# Patient Record
Sex: Female | Born: 1935 | Race: White | Hispanic: No | Smoking: Never smoker
Health system: Southern US, Community
[De-identification: ages and names within clinical notes are randomized; demographics above are authoritative.]

## PROBLEM LIST (undated history)

## (undated) DIAGNOSIS — J45909 Unspecified asthma, uncomplicated: Secondary | ICD-10-CM

## (undated) DIAGNOSIS — C801 Malignant (primary) neoplasm, unspecified: Secondary | ICD-10-CM

## (undated) DIAGNOSIS — C50919 Malignant neoplasm of unspecified site of unspecified female breast: Secondary | ICD-10-CM

## (undated) DIAGNOSIS — Z923 Personal history of irradiation: Secondary | ICD-10-CM

## (undated) HISTORY — PX: ANKLE SURGERY: SHX546

## (undated) HISTORY — PX: KNEE SURGERY: SHX244

## (undated) HISTORY — PX: ABDOMINAL HYSTERECTOMY: SHX81

---

## 1968-02-23 DIAGNOSIS — C801 Malignant (primary) neoplasm, unspecified: Secondary | ICD-10-CM

## 1968-02-23 HISTORY — DX: Malignant (primary) neoplasm, unspecified: C80.1

## 1995-02-23 DIAGNOSIS — C50919 Malignant neoplasm of unspecified site of unspecified female breast: Secondary | ICD-10-CM

## 1995-02-23 DIAGNOSIS — Z923 Personal history of irradiation: Secondary | ICD-10-CM

## 1995-02-23 HISTORY — DX: Malignant neoplasm of unspecified site of unspecified female breast: C50.919

## 1995-02-23 HISTORY — PX: BREAST BIOPSY: SHX20

## 1995-02-23 HISTORY — DX: Personal history of irradiation: Z92.3

## 1995-02-23 HISTORY — PX: BREAST LUMPECTOMY: SHX2

## 2004-03-24 ENCOUNTER — Ambulatory Visit: Payer: Self-pay | Admitting: Unknown Physician Specialty

## 2005-02-19 ENCOUNTER — Ambulatory Visit: Payer: Self-pay | Admitting: Gastroenterology

## 2005-05-20 ENCOUNTER — Ambulatory Visit: Payer: Self-pay | Admitting: Unknown Physician Specialty

## 2006-05-25 ENCOUNTER — Ambulatory Visit: Payer: Self-pay | Admitting: Unknown Physician Specialty

## 2007-06-05 ENCOUNTER — Ambulatory Visit: Payer: Self-pay | Admitting: Unknown Physician Specialty

## 2008-06-10 ENCOUNTER — Ambulatory Visit: Payer: Self-pay | Admitting: Unknown Physician Specialty

## 2008-06-14 ENCOUNTER — Ambulatory Visit: Payer: Self-pay | Admitting: Unknown Physician Specialty

## 2008-11-25 ENCOUNTER — Ambulatory Visit: Payer: Self-pay | Admitting: Surgery

## 2009-05-16 ENCOUNTER — Ambulatory Visit: Payer: Self-pay | Admitting: Internal Medicine

## 2009-05-28 ENCOUNTER — Ambulatory Visit: Payer: Self-pay | Admitting: Orthopedic Surgery

## 2009-06-03 ENCOUNTER — Ambulatory Visit: Payer: Self-pay | Admitting: Orthopedic Surgery

## 2009-09-22 ENCOUNTER — Ambulatory Visit: Payer: Self-pay | Admitting: Orthopedic Surgery

## 2009-09-25 ENCOUNTER — Inpatient Hospital Stay: Payer: Self-pay | Admitting: Orthopedic Surgery

## 2009-11-10 ENCOUNTER — Ambulatory Visit: Payer: Self-pay | Admitting: Unknown Physician Specialty

## 2010-03-18 ENCOUNTER — Ambulatory Visit: Payer: Self-pay | Admitting: Gastroenterology

## 2010-11-19 ENCOUNTER — Ambulatory Visit: Payer: Self-pay | Admitting: Unknown Physician Specialty

## 2010-11-30 ENCOUNTER — Ambulatory Visit: Payer: Self-pay | Admitting: Unknown Physician Specialty

## 2011-12-01 ENCOUNTER — Ambulatory Visit: Payer: Self-pay | Admitting: Internal Medicine

## 2012-12-04 ENCOUNTER — Ambulatory Visit: Payer: Self-pay | Admitting: Internal Medicine

## 2013-01-04 ENCOUNTER — Ambulatory Visit: Payer: Self-pay | Admitting: Internal Medicine

## 2013-12-06 ENCOUNTER — Ambulatory Visit: Payer: Self-pay | Admitting: Internal Medicine

## 2015-05-07 ENCOUNTER — Other Ambulatory Visit: Payer: Self-pay | Admitting: Internal Medicine

## 2015-05-07 DIAGNOSIS — Z1231 Encounter for screening mammogram for malignant neoplasm of breast: Secondary | ICD-10-CM

## 2015-05-22 ENCOUNTER — Ambulatory Visit
Admission: RE | Admit: 2015-05-22 | Discharge: 2015-05-22 | Disposition: A | Discharge status: Home / Self Care | Payer: Medicare Other | Source: Ambulatory Visit | Attending: Internal Medicine | Admitting: Internal Medicine

## 2015-05-22 DIAGNOSIS — Z1231 Encounter for screening mammogram for malignant neoplasm of breast: Secondary | ICD-10-CM | POA: Insufficient documentation

## 2015-05-22 HISTORY — DX: Malignant neoplasm of unspecified site of unspecified female breast: C50.919

## 2015-05-22 HISTORY — DX: Malignant (primary) neoplasm, unspecified: C80.1

## 2016-01-14 ENCOUNTER — Emergency Department: Payer: Medicare Other

## 2016-01-14 ENCOUNTER — Encounter: Payer: Self-pay | Admitting: Emergency Medicine

## 2016-01-14 ENCOUNTER — Emergency Department
Admission: EM | Admit: 2016-01-14 | Discharge: 2016-01-14 | Disposition: A | Discharge status: Home / Self Care | Payer: Medicare Other | Attending: Emergency Medicine | Admitting: Emergency Medicine

## 2016-01-14 DIAGNOSIS — Z853 Personal history of malignant neoplasm of breast: Secondary | ICD-10-CM | POA: Diagnosis not present

## 2016-01-14 DIAGNOSIS — Z79899 Other long term (current) drug therapy: Secondary | ICD-10-CM | POA: Diagnosis not present

## 2016-01-14 DIAGNOSIS — J45901 Unspecified asthma with (acute) exacerbation: Secondary | ICD-10-CM | POA: Diagnosis not present

## 2016-01-14 DIAGNOSIS — R0602 Shortness of breath: Secondary | ICD-10-CM | POA: Diagnosis present

## 2016-01-14 HISTORY — DX: Unspecified asthma, uncomplicated: J45.909

## 2016-01-14 LAB — COMPREHENSIVE METABOLIC PANEL
ALBUMIN: 4.2 g/dL (ref 3.5–5.0)
ALK PHOS: 66 U/L (ref 38–126)
ALT: 20 U/L (ref 14–54)
AST: 28 U/L (ref 15–41)
Anion gap: 9 (ref 5–15)
BILIRUBIN TOTAL: 0.6 mg/dL (ref 0.3–1.2)
BUN: 14 mg/dL (ref 6–20)
CALCIUM: 8.8 mg/dL — AB (ref 8.9–10.3)
CO2: 28 mmol/L (ref 22–32)
CREATININE: 0.7 mg/dL (ref 0.44–1.00)
Chloride: 95 mmol/L — ABNORMAL LOW (ref 101–111)
GFR calc Af Amer: >60 mL/min (ref >60)
GFR calc non Af Amer: >60 mL/min (ref >60)
GLUCOSE: 127 mg/dL — AB (ref 65–99)
Potassium: 3.8 mmol/L (ref 3.5–5.1)
Sodium: 132 mmol/L — ABNORMAL LOW (ref 135–145)
TOTAL PROTEIN: 7.4 g/dL (ref 6.5–8.1)

## 2016-01-14 LAB — CBC WITH DIFFERENTIAL/PLATELET
BASOS ABS: 0.1 10*3/uL (ref 0–0.1)
BASOS PCT: 1 %
EOS ABS: 0.5 10*3/uL (ref 0–0.7)
Eosinophils Relative: 5 %
HCT: 36.2 % (ref 35.0–47.0)
Hemoglobin: 12.7 g/dL (ref 12.0–16.0)
Lymphocytes Relative: 5 %
Lymphs Abs: 0.4 10*3/uL — ABNORMAL LOW (ref 1.0–3.6)
MCH: 32.5 pg (ref 26.0–34.0)
MCHC: 35 g/dL (ref 32.0–36.0)
MCV: 92.9 fL (ref 80.0–100.0)
MONO ABS: 0.5 10*3/uL (ref 0.2–0.9)
Monocytes Relative: 6 %
Neutro Abs: 7.3 10*3/uL — ABNORMAL HIGH (ref 1.4–6.5)
Neutrophils Relative %: 83 %
PLATELETS: 173 10*3/uL (ref 150–440)
RBC: 3.9 MIL/uL (ref 3.80–5.20)
RDW: 13.4 % (ref 11.5–14.5)
WBC: 8.7 10*3/uL (ref 3.6–11.0)

## 2016-01-14 LAB — TROPONIN I: Troponin I: <0.03 ng/mL (ref <0.03)

## 2016-01-14 LAB — BRAIN NATRIURETIC PEPTIDE: B Natriuretic Peptide: 202 pg/mL — ABNORMAL HIGH (ref 0.0–100.0)

## 2016-01-14 MED ORDER — METHYLPREDNISOLONE SODIUM SUCC 125 MG IJ SOLR
125.0000 mg | Freq: Once | INTRAMUSCULAR | Status: AC
  Administered 2016-01-14: 125 mg via INTRAVENOUS
  Filled 2016-01-14: qty 2

## 2016-01-14 MED ORDER — IPRATROPIUM-ALBUTEROL 0.5-2.5 (3) MG/3ML IN SOLN
3.0000 mL | Freq: Once | RESPIRATORY_TRACT | Status: AC
  Administered 2016-01-14: 3 mL via RESPIRATORY_TRACT
  Filled 2016-01-14: qty 3

## 2016-01-14 MED ORDER — PREDNISONE 20 MG PO TABS: 40.0000 mg | ORAL_TABLET | Freq: Every day | ORAL | 0 refills | Status: AC

## 2016-01-14 NOTE — Discharge Instructions (Signed)
Please return immediately if condition worsens. Please contact her primary physician or the physician you were given for referral. If you have any specialist physicians involved in her treatment and plan please also contact them. Thank you for using [facility]. ° °

## 2016-01-14 NOTE — ED Triage Notes (Signed)
Patient to ER via [facility] from home for c/o shortness of breath. Reports chest pain for few moments last night, none currently or today. Patient has h/o asthma, no COPD or CHF reported. Patient states she is more short of breath on exertion. Patient's BP for EMS was 208/108 and 200/100. Was given 324mg  ASA via EMS. +Swelling to BLE. Patient able to speak in complete sentences without difficulty upon arrival. No cardiac history, changes seen en route on EKG for EMS.

## 2016-01-14 NOTE — ED Provider Notes (Signed)
Time Seen: Approximately 0928  I have reviewed the triage notes  Chief Complaint: Shortness of Breath   History of Present Illness: Lynn Griffith is a 82 y.o. female who has a history of asthma and reactive airway disease. Patient notified EMS for some shortness of breath that started last night without any pain to this historian. She did receive aspirin by EMS. She has some chronic bilateral leg swelling but denies any cardiovascular history such as pulmonary edema etc. History of breast cancer but did not develop any pulmonary emboli etc. The patient denies any nausea, vomiting, fever. She has had a dry nonproductive cough and per EMS was 91% on room air. Here she is 99 % on room air.   Past Medical History:  Diagnosis Date  . Asthma   . Breast cancer ([place])    Uterine  . Cancer ([place]) 1997   Breast- radiation- Rt    There are no active problems to display for this patient.   Past Surgical History:  Procedure Laterality Date  . ABDOMINAL HYSTERECTOMY    . ANKLE SURGERY Right   . BREAST BIOPSY Right 1997   +  . KNEE SURGERY Right     Past Surgical History:  Procedure Laterality Date  . ABDOMINAL HYSTERECTOMY    . ANKLE SURGERY Right   . BREAST BIOPSY Right 1997   +  . KNEE SURGERY Right     Current Outpatient Rx  . Order #: [id number] Class: Historical Med  . Order #: [id number] Class: Historical Med  . Order #: [id number] Class: Historical Med  . Order #: [id number] Class: Historical Med  . Order #: [id number] Class: Historical Med  . Order #: [id number] Class: Historical Med  . Order #: [id number] Class: Historical Med  . Order #: [id number] Class: Historical Med  . Order #: [id number] Class: Historical Med    Allergies:  Biaxin [clarithromycin]; Fosamax [alendronate sodium]; Hctz [hydrochlorothiazide]; Mobic [meloxicam]; Norvasc [amlodipine besylate]; and Sulfa antibiotics  Family History: Family History  Problem Relation Age of Onset  . Breast cancer Neg Hx      Social History: Social History  Substance Use Topics  . Smoking status: Never Smoker  . Smokeless tobacco: Never Used  . Alcohol use No     Review of Systems:   10 point review of systems was performed and was otherwise negative:  Constitutional: No fever Eyes: No visual disturbances ENT: No sore throat, ear pain Cardiac: No chest pain Respiratory: Shortness of breath with a dry nonproductive cough Abdomen: No abdominal pain, no vomiting, No diarrhea Endocrine: No weight loss, No night sweats Extremities: Unchanged peripheral edema Skin: No rashes, easy bruising Neurologic: No focal weakness, trouble with speech or swollowing Urologic: No dysuria, Hematuria, or urinary frequency   Physical Exam:  ED Triage Vitals  Enc Vitals Group     BP 01/14/16 0903 (!) 169/94     Pulse Rate 01/14/16 0903 97     Resp 01/14/16 0903 (!) 22     Temp 01/14/16 0903 98.1 F (36.7 C)     Temp Source 01/14/16 0903 Oral     SpO2 01/14/16 0856 91 %     Weight 01/14/16 0904 160 lb (72.6 kg)     Height 01/14/16 0904 4\' 10"  (1.473 m)     Head Circumference --      Peak Flow --      Pain Score --      Pain Loc --      Pain Edu? --      Excl.  in [place]? --     General: Awake , Alert , and Oriented times 3; GCS 15 Patient speaks in interrupted sentences but does not appear to be in any respiratory distress at this time. Wet sounding cough at the bedside Head: Normal cephalic , atraumatic Eyes: Pupils equal , round, reactive to light Nose/Throat: No nasal drainage, patent upper airway without erythema or exudate.  Neck: Supple, Full range of motion, No anterior adenopathy or palpable thyroid masses Lungs: Bilateral wheezing and rhonchi heard symmetrically from the base to the apices  Heart: Regular rate, regular rhythm without murmurs , gallops , or rubs Abdomen: Soft, non tender without rebound, guarding , or rigidity; bowel sounds positive and symmetric in all 4 quadrants. No organomegaly  .        Extremities: 2 plus symmetric pulses. Mild bilateral circumferential edema, no clubbing or cyanosis Neurologic: normal ambulation, Motor symmetric without deficits, sensory intact Skin: warm, dry, no rashes   Labs:   All laboratory work was reviewed including any pertinent negatives or positives listed below:  Labs Reviewed  COMPREHENSIVE METABOLIC PANEL - Abnormal; Notable for the following:       Result Value   Sodium 132 (*)    Chloride 95 (*)    Glucose, Bld 127 (*)    Calcium 8.8 (*)    All other components within normal limits  CBC WITH DIFFERENTIAL/PLATELET - Abnormal; Notable for the following:    Neutro Abs 7.3 (*)    Lymphs Abs 0.4 (*)    All other components within normal limits  BRAIN NATRIURETIC PEPTIDE - Abnormal; Notable for the following:    B Natriuretic Peptide 202.0 (*)    All other components within normal limits  TROPONIN I  Laboratory work was reviewed and showed no clinically significant abnormalities.   EKG: * ED ECG REPORT I, Daymon Larsen, the attending physician, personally viewed and interpreted this ECG.  Date: 01/14/2016 EKG [id number] Rate: 96 Rhythm: normal sinus rhythm QRS Axis: normal Intervals: normal ST/T Wave abnormalities: Nonspecific ST-T wave abnormalities Conduction Disturbances: none Narrative Interpretation: unremarkable No acute ischemic changes   Radiology: "Dg Chest Port 1 View  Result Date: 01/14/2016 CLINICAL DATA:  Shortness of breath, some chest pain, history of asthma EXAM: PORTABLE CHEST 1 VIEW COMPARISON:  Chest x-ray of 09/26/2009 FINDINGS: Minimal linear atelectasis is noted of both lung bases. No pneumonia or effusion is seen. Mediastinal and hilar contours are unremarkable. The heart is within normal limits in size. The bones appear somewhat osteopenic. IMPRESSION: Mild bibasilar linear atelectasis. No definite pneumonia or effusion. Electronically Signed   By: Ivar Drape M.D.   On: 01/14/2016 09:19   " I personally reviewed the radiologic studies    ED Course:  Patient's differential includes pulmonary edema, acute coronary syndrome, pulmonary embolism, acute bronchitis with reactive airway disease, etc. I felt given her current clinical presentation and objective findings this most likely was bronchitis with bronchospasm.  ----------------------------------------- 10:08 AM on 01/14/2016 -----------------------------------------  Patient was reexamined and still has some audible wheezing heard bilaterally in all lung fields and symmetrically duo neb has been ordered  ----------------------------------------- 11:21 AM on 01/14/2016 -----------------------------------------  Second breathing treatment seemed to cause improvement at this time. The patient was advised continue with her current medications at home. Her blood pressure elevations likely transient due to her shortness of breath, etc. I did not see a reason to increase her blood pressure medication at this time. Her second nebulizer and reexamination shows decreased wheezing  and rhonchi bilaterally. She's been up and ambulatory maintaining normal saturations etc. Clinical Course      Assessment:  Acute bronchitis with bronchospasm     Plan:  Outpatient " New Prescriptions   PREDNISONE (DELTASONE) 20 MG TABLET    Take 2 tablets (40 mg total) by mouth daily.  " Patient was advised to return immediately if condition worsens. Patient was advised to follow up with their primary care physician or other specialized physicians involved in their outpatient care. The patient and/or family member/power of attorney had laboratory results reviewed at the bedside. All questions and concerns were addressed and appropriate discharge instructions were distributed by the nursing staff.             Daymon Larsen, MD 01/14/16 [phone]

## 2016-02-05 ENCOUNTER — Other Ambulatory Visit: Payer: Self-pay | Admitting: Internal Medicine

## 2016-02-05 DIAGNOSIS — Z1231 Encounter for screening mammogram for malignant neoplasm of breast: Secondary | ICD-10-CM

## 2016-05-24 ENCOUNTER — Ambulatory Visit: Payer: Medicare Other | Attending: Internal Medicine

## 2016-06-22 ENCOUNTER — Ambulatory Visit
Admission: RE | Admit: 2016-06-22 | Discharge: 2016-06-22 | Disposition: A | Discharge status: Home / Self Care | Payer: Medicare Other | Source: Ambulatory Visit | Attending: Internal Medicine | Admitting: Internal Medicine

## 2016-06-22 DIAGNOSIS — Z1231 Encounter for screening mammogram for malignant neoplasm of breast: Secondary | ICD-10-CM | POA: Diagnosis present

## 2016-06-22 HISTORY — DX: Personal history of irradiation: Z92.3

## 2017-05-25 ENCOUNTER — Other Ambulatory Visit: Payer: Self-pay | Admitting: Internal Medicine

## 2017-05-25 DIAGNOSIS — Z1231 Encounter for screening mammogram for malignant neoplasm of breast: Secondary | ICD-10-CM

## 2017-07-05 ENCOUNTER — Ambulatory Visit
Admission: RE | Admit: 2017-07-05 | Discharge: 2017-07-05 | Disposition: A | Discharge status: Home / Self Care | Payer: Medicare Other | Source: Ambulatory Visit | Attending: Internal Medicine | Admitting: Internal Medicine

## 2017-07-05 DIAGNOSIS — Z1231 Encounter for screening mammogram for malignant neoplasm of breast: Secondary | ICD-10-CM | POA: Diagnosis present

## 2017-08-26 ENCOUNTER — Encounter: Payer: Self-pay | Admitting: Emergency Medicine

## 2017-08-26 ENCOUNTER — Other Ambulatory Visit: Payer: Self-pay

## 2017-08-26 ENCOUNTER — Inpatient Hospital Stay
Admission: EM | Admit: 2017-08-26 | Discharge: 2017-08-28 | DRG: 690 | Disposition: A | Discharge status: Home Health | Payer: Medicare Other | Attending: Internal Medicine | Admitting: Internal Medicine

## 2017-08-26 DIAGNOSIS — G9349 Other encephalopathy: Secondary | ICD-10-CM | POA: Diagnosis present

## 2017-08-26 DIAGNOSIS — Z886 Allergy status to analgesic agent status: Secondary | ICD-10-CM | POA: Diagnosis not present

## 2017-08-26 DIAGNOSIS — J45909 Unspecified asthma, uncomplicated: Secondary | ICD-10-CM | POA: Diagnosis present

## 2017-08-26 DIAGNOSIS — Z881 Allergy status to other antibiotic agents status: Secondary | ICD-10-CM | POA: Diagnosis not present

## 2017-08-26 DIAGNOSIS — N390 Urinary tract infection, site not specified: Secondary | ICD-10-CM | POA: Diagnosis present

## 2017-08-26 DIAGNOSIS — Z90710 Acquired absence of both cervix and uterus: Secondary | ICD-10-CM

## 2017-08-26 DIAGNOSIS — Z79899 Other long term (current) drug therapy: Secondary | ICD-10-CM

## 2017-08-26 DIAGNOSIS — Z853 Personal history of malignant neoplasm of breast: Secondary | ICD-10-CM

## 2017-08-26 DIAGNOSIS — I10 Essential (primary) hypertension: Secondary | ICD-10-CM | POA: Diagnosis present

## 2017-08-26 DIAGNOSIS — Z803 Family history of malignant neoplasm of breast: Secondary | ICD-10-CM

## 2017-08-26 DIAGNOSIS — Z888 Allergy status to other drugs, medicaments and biological substances status: Secondary | ICD-10-CM | POA: Diagnosis not present

## 2017-08-26 DIAGNOSIS — R441 Visual hallucinations: Secondary | ICD-10-CM | POA: Diagnosis present

## 2017-08-26 DIAGNOSIS — Z66 Do not resuscitate: Secondary | ICD-10-CM | POA: Diagnosis not present

## 2017-08-26 DIAGNOSIS — E785 Hyperlipidemia, unspecified: Secondary | ICD-10-CM | POA: Diagnosis present

## 2017-08-26 DIAGNOSIS — R4182 Altered mental status, unspecified: Secondary | ICD-10-CM

## 2017-08-26 DIAGNOSIS — Z923 Personal history of irradiation: Secondary | ICD-10-CM

## 2017-08-26 DIAGNOSIS — R410 Disorientation, unspecified: Secondary | ICD-10-CM | POA: Diagnosis present

## 2017-08-26 DIAGNOSIS — E860 Dehydration: Secondary | ICD-10-CM | POA: Diagnosis present

## 2017-08-26 DIAGNOSIS — Z882 Allergy status to sulfonamides status: Secondary | ICD-10-CM | POA: Diagnosis not present

## 2017-08-26 LAB — URINALYSIS, COMPLETE (UACMP) WITH MICROSCOPIC
BILIRUBIN URINE: NEGATIVE
Bacteria, UA: NONE SEEN
GLUCOSE, UA: NEGATIVE mg/dL
HGB URINE DIPSTICK: NEGATIVE
Ketones, ur: NEGATIVE mg/dL
NITRITE: NEGATIVE
PH: 6 (ref 5.0–8.0)
Protein, ur: NEGATIVE mg/dL
SPECIFIC GRAVITY, URINE: 1.014 (ref 1.005–1.030)

## 2017-08-26 LAB — CBC
HCT: 34 % — ABNORMAL LOW (ref 35.0–47.0)
HEMOGLOBIN: 11.9 g/dL — AB (ref 12.0–16.0)
MCH: 32.4 pg (ref 26.0–34.0)
MCHC: 34.8 g/dL (ref 32.0–36.0)
MCV: 92.9 fL (ref 80.0–100.0)
PLATELETS: 218 10*3/uL (ref 150–440)
RBC: 3.66 MIL/uL — ABNORMAL LOW (ref 3.80–5.20)
RDW: 12.7 % (ref 11.5–14.5)
WBC: 4.6 10*3/uL (ref 3.6–11.0)

## 2017-08-26 LAB — COMPREHENSIVE METABOLIC PANEL
ALT: 16 U/L (ref 0–44)
AST: 23 U/L (ref 15–41)
Albumin: 3.9 g/dL (ref 3.5–5.0)
Alkaline Phosphatase: 79 U/L (ref 38–126)
Anion gap: 11 (ref 5–15)
BUN: 21 mg/dL (ref 8–23)
CO2: 27 mmol/L (ref 22–32)
CREATININE: 0.94 mg/dL (ref 0.44–1.00)
Calcium: 8.9 mg/dL (ref 8.9–10.3)
Chloride: 96 mmol/L — ABNORMAL LOW (ref 98–111)
GFR, EST NON AFRICAN AMERICAN: 52 mL/min — AB (ref >60)
Glucose, Bld: 122 mg/dL — ABNORMAL HIGH (ref 70–99)
POTASSIUM: 4 mmol/L (ref 3.5–5.1)
SODIUM: 134 mmol/L — AB (ref 135–145)
TOTAL PROTEIN: 7.2 g/dL (ref 6.5–8.1)
Total Bilirubin: 0.6 mg/dL (ref 0.3–1.2)

## 2017-08-26 MED ORDER — SODIUM CHLORIDE 0.9 % IV SOLN
1.0000 g | Freq: Once | INTRAVENOUS | Status: AC
  Administered 2017-08-26: 1 g via INTRAVENOUS
  Filled 2017-08-26: qty 10

## 2017-08-26 NOTE — ED Notes (Signed)
Patient assisted to bathroom and given water at this time. Patient updated on plan of care.

## 2017-08-26 NOTE — ED Provider Notes (Signed)
[facility] Emergency Department Provider Note  ____________________________________________   I have reviewed the triage vital signs and the nursing notes.   HISTORY  Chief Complaint Hallucinations  History limited by: Altered Mental Status   HPI Lynn Griffith is a 84 y.o. female who presents to the emergency department today because of concerns for visual hallucinations.  Patient states that she started noticing writing on the wall that is not actually there.  This has been going on for a couple of days.  Family just really noticed it today apparently.  Patient does state for the past couple days she noticed some increased hesitancy with urination.  The patient denies any pain while urination or abdominal pain or back pain.  She denies any fevers.  Denies similar symptoms in the past.   Per medical record review patient has a history of ER visit a couple of years ago for asthma.   Past Medical History:  Diagnosis Date  . Asthma   . Breast cancer ([place]) 1997   Breast- radiation- Rt  . Cancer ([place]) 1970   UTERINE  . Personal history of radiation therapy 1997   BREAST CA    There are no active problems to display for this patient.   Past Surgical History:  Procedure Laterality Date  . ABDOMINAL HYSTERECTOMY    . ANKLE SURGERY Right   . BREAST BIOPSY Right 1997   POS  . BREAST LUMPECTOMY Right 1997   BREAST CA  . KNEE SURGERY Right     Prior to Admission medications   Medication Sig Start Date End Date Taking? Authorizing Provider  amLODipine (NORVASC) 5 MG tablet TAKE ONE TABLET EVERY DAY 09/15/15   [provider]  calcium carbonate (OS-CAL - DOSED IN MG OF ELEMENTAL CALCIUM) 1250 (500 Ca) MG tablet Take 1 tablet by mouth.    [provider]  Cyanocobalamin (VITAMIN B-12 PO) Take by mouth.    [provider]  losartan (COZAAR) 100 MG tablet TAKE ONE TABLET EVERY DAY 09/15/15   [provider]  Omega-3 Fatty  Acids (FISH OIL PO) Take 1 tablet by mouth daily.    [provider]  omeprazole (PRILOSEC) 20 MG capsule TAKE 1 CAPSULE BY MOUTH EVERY DAY 12/15/15   [provider]  PARoxetine (PAXIL) 20 MG tablet TAKE ONE TABLET EVERY DAY 09/15/15   [provider]  pravastatin (PRAVACHOL) 40 MG tablet TAKE ONE TABLET AT BEDTIME 09/15/15   [provider]  VENTOLIN HFA 108 (90 Base) MCG/ACT inhaler 1-2 puffs every 4 (four) hours as needed.  11/07/15   [provider]    Allergies Biaxin [clarithromycin]; Fosamax [alendronate sodium]; Hctz [hydrochlorothiazide]; Mobic [meloxicam]; Norvasc [amlodipine besylate]; and Sulfa antibiotics  Family History  Problem Relation Age of Onset  . Breast cancer Mother 78    Social History Social History   Tobacco Use  . Smoking status: Never Smoker  . Smokeless tobacco: Never Used  Substance Use Topics  . Alcohol use: No  . Drug use: Not on file    Review of Systems Constitutional: No fever/chills Eyes: No visual changes. ENT: No sore throat. Cardiovascular: Denies chest pain. Respiratory: Denies shortness of breath. Gastrointestinal: No abdominal pain.  No nausea, no vomiting.  No diarrhea.   Genitourinary: Negative for dysuria. Positive for difficulty with initiating urination. Musculoskeletal: Negative for back pain. Skin: Negative for rash. Neurological: Negative for headaches, focal weakness or numbness.  Psychiatric: Positive for visual hallucinations.  ____________________________________________   PHYSICAL  EXAM:  VITAL SIGNS: ED Triage Vitals  Enc Vitals Group     BP 08/26/17 1632 (!) 148/83     Pulse Rate 08/26/17 1632 86     Resp 08/26/17 1632 18     Temp 08/26/17 1632 98.8 F (37.1 C)     Temp Source 08/26/17 1632 Oral     SpO2 08/26/17 1632 96 %     Weight 08/26/17 1637 160 lb (72.6 kg)     Height 08/26/17 1637 5' (1.524 m)     Head Circumference --      Peak Flow --      Pain Score  08/26/17 1637 0   Constitutional: Alert and oriented.  Eyes: Conjunctivae are normal.  ENT      Head: Normocephalic and atraumatic.      Nose: No congestion/rhinnorhea.      Mouth/Throat: Mucous membranes are moist.      Neck: No stridor. Hematological/Lymphatic/Immunilogical: No cervical lymphadenopathy. Cardiovascular: Normal rate, regular rhythm.  No murmurs, rubs, or gallops.  Respiratory: Normal respiratory effort without tachypnea nor retractions. Breath sounds are clear and equal bilaterally. No wheezes/rales/rhonchi. Gastrointestinal: Soft and non tender. No rebound. No guarding.  Genitourinary: Deferred Musculoskeletal: Normal range of motion in all extremities. No lower extremity edema. Neurologic:  Normal speech and language. No gross focal neurologic deficits are appreciated.  Skin:  Skin is warm, dry and intact. No rash noted. Psychiatric: Patient endorses visual hallucinations, does not appear to be responding to internal stimuli.   ____________________________________________    LABS (pertinent positives/negatives)  UA hazy, moderate leukocytes, wbc >50 CMP na 134, k 4.0, glu 122, cr 0.94 CBC wbc 4.6, hgb 11.9, plt 218  ____________________________________________   EKG  None  ____________________________________________    RADIOLOGY  None  ____________________________________________   PROCEDURES  Procedures  ____________________________________________   INITIAL IMPRESSION / ASSESSMENT AND PLAN / ED COURSE  Pertinent labs & imaging results that were available during my care of the patient were reviewed by me and considered in my medical decision making (see chart for details).   Patient presented to the emergency department today because of concerns for hallucinations.  Differential would be broad including infection, medications, intracranial process, psychiatric illness amongst other etiologies.  Urine is consistent with urinary tract  infection.  I do that this could explain the patient's symptoms.  Will plan on IV antibiotics and admission.  Discussed findings and plan with patient and family.   ____________________________________________   FINAL CLINICAL IMPRESSION(S) / ED DIAGNOSES  Final diagnoses:  Urinary tract infection without hematuria, site unspecified  Altered mental status, unspecified altered mental status type     Note: This dictation was prepared with Dragon dictation. Any transcriptional errors that result from this process are unintentional     Nance Pear, MD 08/26/17 2129

## 2017-08-26 NOTE — ED Triage Notes (Addendum)
PT to ED via [facility] with daughter. PT states she is having visual hallucinations and writing on walls in home xfwe days. PT is A&Ox4, calm and cooperative. PT denies any SI/HI. VSS . Per daughter pt c/o increased urination yesterday. Denise dysuria

## 2017-08-26 NOTE — H&P (Addendum)
[facility] [place] NAME: Shalina Norfolk    MR#:  [id number]  DATE OF BIRTH:  July 29, 1924  DATE OF ADMISSION:  08/26/2017  PRIMARY CARE PHYSICIAN: Adin Hector, MD   REQUESTING/REFERRING PHYSICIAN:   CHIEF COMPLAINT:  No chief complaint on file.   HISTORY OF PRESENT ILLNESS: Alonah Lineback  is a 84 y.o. female with a known history of asthma and remote breast cancer.  Patient is currently confused, unable to provide reliable history.  Most of the information was taken from reviewing the medical records and from discussion with emergency room physician and the family. Patient was brought to emergency room for confusion and visual hallucinations, noted by the family in the past 24 to 48 hours.  Per patient, she is seeing objects that are not actually there.  Per family, this never happened to the patient before. No reports of fever, chills, cough, nausea, vomiting, diarrhea.  Patient denies urinary symptoms.  No new medications. Patient lives by herself in a senior community.  Per family, her memory seems to be getting worse in the past 6 months. Blood test done emergency room, including CBC and CMP, are grossly unremarkable.  UA is positive for UTI. Patient is admitted for further evaluation and treatment.   PAST MEDICAL HISTORY:   Past Medical History:  Diagnosis Date  . Asthma   . Breast cancer ([place]) 1997   Breast- radiation- Rt  . Cancer ([place]) 1970   UTERINE  . Personal history of radiation therapy 1997   BREAST CA    PAST SURGICAL HISTORY:  Past Surgical History:  Procedure Laterality Date  . ABDOMINAL HYSTERECTOMY    . ANKLE SURGERY Right   . BREAST BIOPSY Right 1997   POS  . BREAST LUMPECTOMY Right 1997   BREAST CA  . KNEE SURGERY Right     SOCIAL HISTORY:  Social History   Tobacco Use  . Smoking status: Never Smoker  . Smokeless tobacco: Never Used  Substance Use Topics  . Alcohol use: No    FAMILY  HISTORY:  Family History  Problem Relation Age of Onset  . Breast cancer Mother 5    DRUG ALLERGIES:  Allergies  Allergen Reactions  . Biaxin [Clarithromycin]   . Fosamax [Alendronate Sodium]     "increases her asthma"  . Hctz [Hydrochlorothiazide]   . Mobic [Meloxicam]   . Norvasc [Amlodipine Besylate] Swelling    rash  . Sulfa Antibiotics     REVIEW OF SYSTEMS:   Unable to obtain due to patient's confusion.  MEDICATIONS AT HOME:  Prior to Admission medications   Medication Sig Start Date End Date Taking? Authorizing Provider  amLODipine (NORVASC) 5 MG tablet TAKE ONE TABLET EVERY DAY 09/15/15   [provider]  calcium carbonate (OS-CAL - DOSED IN MG OF ELEMENTAL CALCIUM) 1250 (500 Ca) MG tablet Take 1 tablet by mouth.    [provider]  Cyanocobalamin (VITAMIN B-12 PO) Take by mouth.    [provider]  losartan (COZAAR) 100 MG tablet TAKE ONE TABLET EVERY DAY 09/15/15   [provider]  Omega-3 Fatty Acids (FISH OIL PO) Take 1 tablet by mouth daily.    [provider]  omeprazole (PRILOSEC) 20 MG capsule TAKE 1 CAPSULE BY MOUTH EVERY DAY 12/15/15   [provider]  PARoxetine (PAXIL) 20 MG tablet TAKE ONE TABLET EVERY DAY 09/15/15   [provider]  pravastatin (PRAVACHOL) 40 MG tablet TAKE  ONE TABLET AT BEDTIME 09/15/15   [provider]  VENTOLIN HFA 108 (90 Base) MCG/ACT inhaler 1-2 puffs every 4 (four) hours as needed.  11/07/15   [provider]      PHYSICAL EXAMINATION:   VITAL SIGNS: Blood pressure (!) 160/73, pulse 72, temperature 98.8 F (37.1 C), temperature source Oral, resp. rate 18, height 5' (1.524 m), weight 72.6 kg (160 lb), SpO2 96 %.  GENERAL:  84 y.o.-year-old patient lying in the bed with no acute distress, pleasantly confused. EYES: Pupils equal, round, reactive to light and accommodation. No scleral icterus. Extraocular muscles intact.  HEENT: Head atraumatic,  normocephalic. Oropharynx and nasopharynx clear.  NECK:  Supple, no jugular venous distention. No thyroid enlargement, no tenderness.  LUNGS: Normal breath sounds bilaterally, no wheezing, rales,rhonchi or crepitation. No use of accessory muscles of respiration.  CARDIOVASCULAR: S1, S2 normal. No S3/S4.  ABDOMEN: Soft, nontender, nondistended. Bowel sounds present. No organomegaly or mass.  EXTREMITIES: No pedal edema, cyanosis, or clubbing.  NEUROLOGIC: Cranial nerves II through XII are intact. Muscle strength 5/5 in all extremities. Sensation intact. PSYCHIATRIC: The patient is alert and oriented x2.  SKIN: No obvious rash, lesion, or ulcer.   LABORATORY PANEL:   CBC Recent Labs  Lab 08/26/17 1639  WBC 4.6  HGB 11.9*  HCT 34.0*  PLT 218  MCV 92.9  MCH 32.4  MCHC 34.8  RDW 12.7   ------------------------------------------------------------------------------------------------------------------  Chemistries  Recent Labs  Lab 08/26/17 1639  NA 134*  K 4.0  CL 96*  CO2 27  GLUCOSE 122*  BUN 21  CREATININE 0.94  CALCIUM 8.9  AST 23  ALT 16  ALKPHOS 79  BILITOT 0.6   ------------------------------------------------------------------------------------------------------------------ estimated creatinine clearance is 35.4 mL/min (by C-G formula based on SCr of 0.94 mg/dL). ------------------------------------------------------------------------------------------------------------------ No results for input(s): TSH, T4TOTAL, T3FREE, THYROIDAB in the last 72 hours.  Invalid input(s): FREET3   Coagulation profile No results for input(s): INR, PROTIME in the last 168 hours. ------------------------------------------------------------------------------------------------------------------- No results for input(s): DDIMER in the last 72 hours. -------------------------------------------------------------------------------------------------------------------  Cardiac  Enzymes No results for input(s): CKMB, TROPONINI, MYOGLOBIN in the last 168 hours.  Invalid input(s): CK ------------------------------------------------------------------------------------------------------------------ Invalid input(s): POCBNP  ---------------------------------------------------------------------------------------------------------------  Urinalysis    Component Value Date/Time   COLORURINE YELLOW (A) 08/26/2017 1726   APPEARANCEUR HAZY (A) 08/26/2017 1726   LABSPEC 1.014 08/26/2017 1726   PHURINE 6.0 08/26/2017 1726   GLUCOSEU NEGATIVE 08/26/2017 1726   HGBUR NEGATIVE 08/26/2017 1726   BILIRUBINUR NEGATIVE 08/26/2017 1726   KETONESUR NEGATIVE 08/26/2017 1726   PROTEINUR NEGATIVE 08/26/2017 1726   NITRITE NEGATIVE 08/26/2017 1726   LEUKOCYTESUR MODERATE (A) 08/26/2017 1726     RADIOLOGY: No results found.  EKG: Orders placed or performed during the hospital encounter of 01/14/16  . EKG 12-Lead  . EKG 12-Lead    IMPRESSION AND PLAN:  1.  Acute encephalopathy with visual hallucinations.  This could be related to acute UTI.  We will start treatment with IV fluids and IV antibiotics.  Will check CT scan of the head, further evaluate for dementia. 2.  Acute UTI, will start iv abx, Rocephin. Urine cx pending final result.  3.  Hypertension, stable, continue home medications. 4.  Hyperlipidemia, on statin.  All the records are reviewed and case discussed with ED provider. Management plans discussed with the patient, family and they are in agreement.  CODE STATUS: Advance Directive Documentation     Most Recent Value  Type of Advance Directive  Living  will, Healthcare Power of Attorney  Pre-existing out of facility DNR order (yellow form or pink MOST form)  -  "MOST" Form in Place?  -       TOTAL TIME TAKING CARE OF THIS PATIENT: 45 minutes.    Amelia Jo M.D on 08/26/2017 at 11:09 PM  Between 7am to 6pm - Pager - [phone]  After 6pm go to  [email or web address] - password [email or web address] [facility]  [facility]  Office  [phone]  CC: Primary care physician; Adin Hector, MD

## 2017-08-27 ENCOUNTER — Encounter: Payer: Self-pay | Admitting: *Deleted

## 2017-08-27 ENCOUNTER — Inpatient Hospital Stay: Payer: Medicare Other

## 2017-08-27 LAB — BASIC METABOLIC PANEL
ANION GAP: 10 (ref 5–15)
BUN: 25 mg/dL — ABNORMAL HIGH (ref 8–23)
CALCIUM: 8.6 mg/dL — AB (ref 8.9–10.3)
CHLORIDE: 99 mmol/L (ref 98–111)
CO2: 27 mmol/L (ref 22–32)
Creatinine, Ser: 0.72 mg/dL (ref 0.44–1.00)
GFR calc Af Amer: >60 mL/min (ref >60)
GFR calc non Af Amer: >60 mL/min (ref >60)
Glucose, Bld: 106 mg/dL — ABNORMAL HIGH (ref 70–99)
Potassium: 3.8 mmol/L (ref 3.5–5.1)
Sodium: 136 mmol/L (ref 135–145)

## 2017-08-27 LAB — CBC
HCT: 31.4 % — ABNORMAL LOW (ref 35.0–47.0)
HEMOGLOBIN: 11.2 g/dL — AB (ref 12.0–16.0)
MCH: 32.9 pg (ref 26.0–34.0)
MCHC: 35.6 g/dL (ref 32.0–36.0)
MCV: 92.4 fL (ref 80.0–100.0)
Platelets: 192 10*3/uL (ref 150–440)
RBC: 3.4 MIL/uL — AB (ref 3.80–5.20)
RDW: 12.9 % (ref 11.5–14.5)
WBC: 5.8 10*3/uL (ref 3.6–11.0)

## 2017-08-27 LAB — GLUCOSE, CAPILLARY: GLUCOSE-CAPILLARY: 95 mg/dL (ref 70–99)

## 2017-08-27 MED ORDER — LOSARTAN POTASSIUM 50 MG PO TABS
100.0000 mg | ORAL_TABLET | Freq: Every day | ORAL | Status: DC
  Administered 2017-08-27 – 2017-08-28 (×2): 100 mg via ORAL
  Filled 2017-08-27 (×2): qty 2

## 2017-08-27 MED ORDER — OMEGA-3-ACID ETHYL ESTERS 1 G PO CAPS
1.0000 g | ORAL_CAPSULE | Freq: Every day | ORAL | Status: DC
  Administered 2017-08-27 – 2017-08-28 (×2): 1 g via ORAL
  Filled 2017-08-27 (×2): qty 1

## 2017-08-27 MED ORDER — ALBUTEROL SULFATE (2.5 MG/3ML) 0.083% IN NEBU
2.5000 mg | INHALATION_SOLUTION | RESPIRATORY_TRACT | Status: DC | PRN
  Administered 2017-08-27: 2.5 mg via RESPIRATORY_TRACT
  Filled 2017-08-27: qty 3

## 2017-08-27 MED ORDER — BISACODYL 5 MG PO TBEC: 5.0000 mg | DELAYED_RELEASE_TABLET | Freq: Every day | ORAL | Status: DC | PRN

## 2017-08-27 MED ORDER — DOCUSATE SODIUM 100 MG PO CAPS
100.0000 mg | ORAL_CAPSULE | Freq: Two times a day (BID) | ORAL | Status: DC
  Administered 2017-08-27 – 2017-08-28 (×2): 100 mg via ORAL
  Filled 2017-08-27 (×2): qty 1

## 2017-08-27 MED ORDER — METOPROLOL TARTRATE 25 MG PO TABS
25.0000 mg | ORAL_TABLET | Freq: Two times a day (BID) | ORAL | Status: DC
  Administered 2017-08-27 – 2017-08-28 (×3): 25 mg via ORAL
  Filled 2017-08-27 (×3): qty 1

## 2017-08-27 MED ORDER — SODIUM CHLORIDE 0.9 % IV SOLN
1.0000 g | INTRAVENOUS | Status: DC
  Administered 2017-08-27: 22:00:00 1 g via INTRAVENOUS
  Filled 2017-08-27: qty 1

## 2017-08-27 MED ORDER — HYDRALAZINE HCL 20 MG/ML IJ SOLN: 10.0000 mg | Freq: Four times a day (QID) | INTRAMUSCULAR | Status: DC | PRN

## 2017-08-27 MED ORDER — CALCIUM CARBONATE 1250 (500 CA) MG PO TABS
1.0000 | ORAL_TABLET | Freq: Every day | ORAL | Status: DC
  Administered 2017-08-27 – 2017-08-28 (×2): 500 mg via ORAL
  Filled 2017-08-27 (×3): qty 1

## 2017-08-27 MED ORDER — PRAVASTATIN SODIUM 20 MG PO TABS
40.0000 mg | ORAL_TABLET | Freq: Every day | ORAL | Status: DC
  Administered 2017-08-27: 40 mg via ORAL
  Filled 2017-08-27: qty 2

## 2017-08-27 MED ORDER — VITAMIN B-12 100 MCG PO TABS
100.0000 ug | ORAL_TABLET | Freq: Every day | ORAL | Status: DC
  Administered 2017-08-27 – 2017-08-28 (×2): 100 ug via ORAL
  Filled 2017-08-27 (×3): qty 1

## 2017-08-27 MED ORDER — PANTOPRAZOLE SODIUM 40 MG PO TBEC
40.0000 mg | DELAYED_RELEASE_TABLET | Freq: Every day | ORAL | Status: DC
  Administered 2017-08-27 – 2017-08-28 (×2): 40 mg via ORAL
  Filled 2017-08-27 (×2): qty 1

## 2017-08-27 MED ORDER — ONDANSETRON HCL 4 MG PO TABS: 4.0000 mg | ORAL_TABLET | Freq: Four times a day (QID) | ORAL | Status: DC | PRN

## 2017-08-27 MED ORDER — SODIUM CHLORIDE 0.9 % IV SOLN
Freq: Once | INTRAVENOUS | Status: AC
  Administered 2017-08-27: 01:00:00 via INTRAVENOUS

## 2017-08-27 MED ORDER — ACETAMINOPHEN 650 MG RE SUPP: 650.0000 mg | Freq: Four times a day (QID) | RECTAL | Status: DC | PRN

## 2017-08-27 MED ORDER — ONDANSETRON HCL 4 MG/2ML IJ SOLN: 4.0000 mg | Freq: Four times a day (QID) | INTRAMUSCULAR | Status: DC | PRN

## 2017-08-27 MED ORDER — PAROXETINE HCL 20 MG PO TABS
20.0000 mg | ORAL_TABLET | Freq: Every day | ORAL | Status: DC
  Administered 2017-08-27 – 2017-08-28 (×2): 20 mg via ORAL
  Filled 2017-08-27 (×3): qty 1

## 2017-08-27 MED ORDER — HEPARIN SODIUM (PORCINE) 5000 UNIT/ML IJ SOLN
5000.0000 [IU] | Freq: Three times a day (TID) | INTRAMUSCULAR | Status: DC
  Administered 2017-08-27 – 2017-08-28 (×4): 5000 [IU] via SUBCUTANEOUS
  Filled 2017-08-27 (×4): qty 1

## 2017-08-27 MED ORDER — ACETAMINOPHEN 325 MG PO TABS: 650.0000 mg | ORAL_TABLET | Freq: Four times a day (QID) | ORAL | Status: DC | PRN

## 2017-08-27 MED ORDER — TRAZODONE HCL 50 MG PO TABS: 25.0000 mg | ORAL_TABLET | Freq: Every evening | ORAL | Status: DC | PRN

## 2017-08-27 MED ORDER — HYDROCODONE-ACETAMINOPHEN 5-325 MG PO TABS
1.0000 | ORAL_TABLET | ORAL | Status: DC | PRN
  Administered 2017-08-27: 1 via ORAL
  Filled 2017-08-27: qty 1

## 2017-08-27 MED ORDER — AMLODIPINE BESYLATE 5 MG PO TABS
5.0000 mg | ORAL_TABLET | Freq: Every day | ORAL | Status: DC
  Administered 2017-08-27 – 2017-08-28 (×2): 5 mg via ORAL
  Filled 2017-08-27 (×2): qty 1

## 2017-08-27 NOTE — Progress Notes (Signed)
[facility] [place] NAME: Lynn Griffith    MR#:  [id number]  DATE OF BIRTH:  22-Aug-1924  SUBJECTIVE:  CHIEF COMPLAINT:  No chief complaint on file.  visual hallucinations.  REVIEW OF SYSTEMS:  Review of Systems  Constitutional: Negative for chills, fever and malaise/fatigue.  HENT: Negative for sore throat.   Eyes: Negative for blurred vision and double vision.  Respiratory: Negative for cough, hemoptysis, shortness of breath, wheezing and stridor.   Cardiovascular: Negative for chest pain, palpitations, orthopnea and leg swelling.  Gastrointestinal: Negative for abdominal pain, blood in stool, diarrhea, melena, nausea and vomiting.  Genitourinary: Negative for dysuria, flank pain and hematuria.  Musculoskeletal: Negative for back pain and joint pain.  Skin: Negative for rash.  Neurological: Negative for dizziness, sensory change, focal weakness, seizures, loss of consciousness, weakness and headaches.       Visual hallucinations.   Endo/Heme/Allergies: Negative for polydipsia.  Psychiatric/Behavioral: Negative for depression. The patient is not nervous/anxious.     DRUG ALLERGIES:   Allergies  Allergen Reactions  . Biaxin [Clarithromycin]   . Fosamax [Alendronate Sodium]     "increases her asthma"  . Hctz [Hydrochlorothiazide]   . Mobic [Meloxicam]   . Norvasc [Amlodipine Besylate] Swelling    rash  . Sulfa Antibiotics    VITALS:  Blood pressure (!) 195/98, pulse 83, temperature 98.5 F (36.9 C), temperature source Oral, resp. rate 16, height 5' (1.524 m), weight 150 lb 5 oz (68.2 kg), SpO2 95 %. PHYSICAL EXAMINATION:  Physical Exam  Constitutional: She is oriented to person, place, and time. She appears well-developed.  HENT:  Head: Normocephalic.  Mouth/Throat: Oropharynx is clear and moist.  Eyes: Pupils are equal, round, and reactive to light. Conjunctivae and EOM are normal. No scleral icterus.  Neck: Normal range  of motion. Neck supple. No JVD present. No tracheal deviation present.  Cardiovascular: Normal rate, regular rhythm and normal heart sounds. Exam reveals no gallop.  No murmur heard. Pulmonary/Chest: Effort normal and breath sounds normal. No respiratory distress. She has no wheezes. She has no rales.  Abdominal: Soft. Bowel sounds are normal. She exhibits no distension. There is no tenderness. There is no rebound.  Musculoskeletal: Normal range of motion. She exhibits no edema or tenderness.  Neurological: She is alert and oriented to person, place, and time. No cranial nerve deficit.  Skin: No rash noted. No erythema.  Psychiatric: She has a normal mood and affect.   LABORATORY PANEL:  Female CBC Recent Labs  Lab 08/27/17 0434  WBC 5.8  HGB 11.2*  HCT 31.4*  PLT 192   ------------------------------------------------------------------------------------------------------------------ Chemistries  Recent Labs  Lab 08/26/17 1639 08/27/17 0434  NA 134* 136  K 4.0 3.8  CL 96* 99  CO2 27 27  GLUCOSE 122* 106*  BUN 21 25*  CREATININE 0.94 0.72  CALCIUM 8.9 8.6*  AST 23  --   ALT 16  --   ALKPHOS 79  --   BILITOT 0.6  --    RADIOLOGY:  Ct Head Wo Contrast  Result Date: 08/27/2017 CLINICAL DATA:  Increase in confusion.  History of breast carcinoma EXAM: CT HEAD WITHOUT CONTRAST TECHNIQUE: Contiguous axial images were obtained from the base of the skull through the vertex without intravenous contrast. COMPARISON:  None. FINDINGS: Brain: There is mild diffuse atrophy. There is no intracranial mass, hemorrhage, extra-axial fluid collection, or midline shift. There is patchy small vessel disease in the centra semiovale bilaterally. There  is also small vessel disease in each thalamus region. Elsewhere gray-white compartments appear normal. No evident acute infarct. Vascular: There is no appreciable hyperdense vessel. There is calcification in the distal left vertebral artery as well as in  both carotid siphon regions. Skull: The bony calvarium appears intact. Sinuses/Orbits: There is mucosal thickening in several ethmoid air cells. Other visualized paranasal sinuses are clear. Visualized orbits appear symmetric bilaterally. Other: Visualized mastoid air cells are clear. IMPRESSION: Atrophy with patchy supratentorial small vessel disease. No evident acute infarct. No mass or hemorrhage. There are foci of arterial vascular calcification. There is mucosal thickening in several ethmoid air cells. Electronically Signed   By: Lowella Grip III M.D.   On: 08/27/2017 08:15   ASSESSMENT AND PLAN:   1.  Acute encephalopathy with visual hallucinations.  This could be related to acute UTI.  CT scan of the head: Unremarkable. The patient has better visual hallucinations. 2.  Acute UTI, Continue IV Rocephin and follow-up urine culture. 3.  Hypertension, accelerated, start Lopressor p.o. and IV hydralazine, continue home medications. 4.  Hyperlipidemia, on statin.  Dehydration.  Encourage oral fluid intake.  Follow-up BMP.  All the records are reviewed and case discussed with Care Management/Social Worker. Management plans discussed with the patient, her son and daughter and they are in agreement.  CODE STATUS: DNR  TOTAL TIME TAKING CARE OF THIS PATIENT: 26 minutes.   More than 50% of the time was spent in counseling/coordination of care: YES  POSSIBLE D/C IN 1 DAYS, DEPENDING ON CLINICAL CONDITION.   Demetrios Loll M.D on 08/27/2017 at 11:53 AM  Between 7am to 6pm - Pager - [phone]  After 6pm go to [email or web address] - [facility]

## 2017-08-27 NOTE — Progress Notes (Signed)
Advanced Care Plan.  Purpose of Encounter: CODE STATUS. Parties in Attendance: The patient, her son and daughter, me. Patient's Decisional Capacity:  Medical Story: Lynn Griffith  is a 84 y.o. female with a known history of asthma and remote breast cancer.   She is admitted for confusion and with visual  hallucination.  She is found urine infection and accelerated hypertension.  I discussed with the patient about the patient's condition, prognosis and CODE STATUS.  The patient wants DNR.  Her son and daughter agreed and confirmed DNR.  I changed from full code to DNR status. Plan:  Code Status: DNR. Time spent discussing advance care planning: 17 minutes.

## 2017-08-28 LAB — BASIC METABOLIC PANEL
Anion gap: 8 (ref 5–15)
BUN: 19 mg/dL (ref 8–23)
CALCIUM: 9 mg/dL (ref 8.9–10.3)
CHLORIDE: 101 mmol/L (ref 98–111)
CO2: 29 mmol/L (ref 22–32)
CREATININE: 0.82 mg/dL (ref 0.44–1.00)
GFR calc Af Amer: >60 mL/min (ref >60)
GFR calc non Af Amer: >60 mL/min (ref >60)
GLUCOSE: 116 mg/dL — AB (ref 70–99)
Potassium: 3.9 mmol/L (ref 3.5–5.1)
Sodium: 138 mmol/L (ref 135–145)

## 2017-08-28 LAB — URINE CULTURE: Culture: NO GROWTH

## 2017-08-28 LAB — GLUCOSE, CAPILLARY: Glucose-Capillary: 97 mg/dL (ref 70–99)

## 2017-08-28 MED ORDER — AMLODIPINE BESYLATE 5 MG PO TABS: 5.0000 mg | ORAL_TABLET | Freq: Every day | ORAL | Status: DC

## 2017-08-28 MED ORDER — CEPHALEXIN 500 MG PO CAPS: 500.0000 mg | ORAL_CAPSULE | Freq: Two times a day (BID) | ORAL | 0 refills | Status: AC

## 2017-08-28 MED ORDER — METOPROLOL TARTRATE 25 MG PO TABS: 25.0000 mg | ORAL_TABLET | Freq: Two times a day (BID) | ORAL | 0 refills | Status: AC

## 2017-08-28 MED ORDER — CEPHALEXIN 500 MG PO CAPS
500.0000 mg | ORAL_CAPSULE | Freq: Two times a day (BID) | ORAL | Status: DC
  Administered 2017-08-28: 09:00:00 500 mg via ORAL
  Filled 2017-08-28: qty 1

## 2017-08-28 MED ORDER — AMLODIPINE BESYLATE 10 MG PO TABS
10.0000 mg | ORAL_TABLET | Freq: Every day | ORAL | Status: DC
  Filled 2017-08-28: qty 1

## 2017-08-28 NOTE — Care Management Note (Signed)
Case Management Note  Patient Details  Name: Lynn Griffith MRN: [id number] Date of Birth: 17-Feb-1925  Subjective/Objective:   Patient to be discharged per MD order. Orders in place for home health services. Patient and family agreeable to home health and given choice would like advanced home care. Referral placed with [name] from [facility]  For RN and PT services. Patient has walker in the home and has no further RNCM needs. Daughter [name] to provide transport home.                 Action/Plan:   Expected Discharge Date:  08/28/17               Expected Discharge Plan:     In-House Referral:     Discharge planning Services  CM Consult  Post Acute Care Choice:  Home Health Choice offered to:  Patient  DME Arranged:    DME Agency:     HH Arranged:  RN, PT [place] Agency:  [facility]  Status of Service:  Completed, signed off  If discussed at [place] of Stay Meetings, dates discussed:    Additional Comments:  Latanya Maudlin, RN 08/28/2017, 3:15 PM

## 2017-08-28 NOTE — Discharge Summary (Addendum)
[facility] at [place] NAME: Lynn Griffith    MR#:  [id number]  DATE OF BIRTH:  04-Jun-1924  DATE OF ADMISSION:  08/26/2017   ADMITTING PHYSICIAN: Amelia Jo, MD  DATE OF DISCHARGE:  08/28/2017 PRIMARY CARE PHYSICIAN: Tama High III, MD   ADMISSION DIAGNOSIS:  Urinary tract infection without hematuria, site unspecified [N39.0] Altered mental status, unspecified altered mental status type [R41.82] DISCHARGE DIAGNOSIS:  Active Problems:   UTI (urinary tract infection)  SECONDARY DIAGNOSIS:   Past Medical History:  Diagnosis Date  . Asthma   . Breast cancer ([facility]) 1997   Breast- radiation- Rt  . Cancer ([facility]) 1970   UTERINE  . Personal history of radiation therapy [place]:  1.Acute encephalopathy with visual hallucinations.This could be related to acute UTI. CT scan of the head: Unremarkable. The patient has no visual hallucinations today. 2. Acute UTI,  she is treated with IV Rocephin and no gross per urine culture.  Changed to Keflex p.o. for 3 days. 3.Hypertension, accelerated, started Lopressor p.o. and IV hydralazine, continue home medications. 4.Hyperlipidemia,on statin. Dehydration.  Encourage oral fluid intake.    Improved. DISCHARGE CONDITIONS:  Stable, discharged to home today with home health and PT. CONSULTS OBTAINED:   DRUG ALLERGIES:   Allergies  Allergen Reactions  . Biaxin [Clarithromycin]   . Fosamax [Alendronate Sodium]     "increases her asthma"  . Hctz [Hydrochlorothiazide]   . Mobic [Meloxicam]   . Norvasc [Amlodipine Besylate] Swelling    rash  . Sulfa Antibiotics    DISCHARGE MEDICATIONS:   Allergies as of 08/28/2017      Reactions   Biaxin [clarithromycin]    Fosamax [alendronate Sodium]    "increases her asthma"   Hctz [hydrochlorothiazide]    Mobic [meloxicam]    Norvasc [amlodipine Besylate] Swelling   rash   Sulfa Antibiotics       Medication  List    TAKE these medications   amLODipine 5 MG tablet Commonly known as:  NORVASC TAKE ONE TABLET EVERY DAY   calcium carbonate 1250 (500 Ca) MG tablet Commonly known as:  OS-CAL - dosed in mg of elemental calcium Take 1 tablet by mouth.   cephALEXin 500 MG capsule Commonly known as:  KEFLEX Take 1 capsule (500 mg total) by mouth every 12 (twelve) hours.   FISH OIL PO Take 1 tablet by mouth daily.   losartan 100 MG tablet Commonly known as:  COZAAR TAKE ONE TABLET EVERY DAY   metoprolol tartrate 25 MG tablet Commonly known as:  LOPRESSOR Take 1 tablet (25 mg total) by mouth 2 (two) times daily.   omeprazole 20 MG capsule Commonly known as:  PRILOSEC TAKE 1 CAPSULE BY MOUTH EVERY DAY   PARoxetine 20 MG tablet Commonly known as:  PAXIL TAKE ONE TABLET EVERY DAY   pravastatin 40 MG tablet Commonly known as:  PRAVACHOL TAKE ONE TABLET AT BEDTIME   VENTOLIN HFA 108 (90 Base) MCG/ACT inhaler Generic drug:  albuterol 1-2 puffs every 4 (four) hours as needed.   VITAMIN B-12 PO Take by mouth.        DISCHARGE INSTRUCTIONS:  See AVS.  If you experience worsening of your admission symptoms, develop shortness of breath, life threatening emergency, suicidal or homicidal thoughts you must seek medical attention immediately by calling 911 or calling your MD immediately  if symptoms less severe.  You Must read complete instructions/literature along with  all the possible adverse reactions/side effects for all the Medicines you take and that have been prescribed to you. Take any new Medicines after you have completely understood and accpet all the possible adverse reactions/side effects.   Please note  You were cared for by a hospitalist during your hospital stay. If you have any questions about your discharge medications or the care you received while you were in the hospital after you are discharged, you can call the unit and asked to speak with the hospitalist on call if  the hospitalist that took care of you is not available. Once you are discharged, your primary care physician will handle any further medical issues. Please note that NO REFILLS for any discharge medications will be authorized once you are discharged, as it is imperative that you return to your primary care physician (or establish a relationship with a primary care physician if you do not have one) for your aftercare needs so that they can reassess your need for medications and monitor your lab values.    On the day of Discharge:  VITAL SIGNS:  Blood pressure (!) 158/70, pulse (!) 58, temperature 98.2 F (36.8 C), temperature source Oral, resp. rate 16, height 5' (1.524 m), weight 150 lb 5 oz (68.2 kg), SpO2 99 %. PHYSICAL EXAMINATION:  GENERAL:  84 y.o.-year-old patient lying in the bed with no acute distress.  EYES: Pupils equal, round, reactive to light and accommodation. No scleral icterus. Extraocular muscles intact.  HEENT: Head atraumatic, normocephalic. Oropharynx and nasopharynx clear.  NECK:  Supple, no jugular venous distention. No thyroid enlargement, no tenderness.  LUNGS: Normal breath sounds bilaterally, no wheezing, rales,rhonchi or crepitation. No use of accessory muscles of respiration.  CARDIOVASCULAR: S1, S2 normal. No murmurs, rubs, or gallops.  ABDOMEN: Soft, non-tender, non-distended. Bowel sounds present. No organomegaly or mass.  EXTREMITIES: No pedal edema, cyanosis, or clubbing.  NEUROLOGIC: Cranial nerves II through XII are intact. Muscle strength 4/5 in all extremities. Sensation intact. Gait not checked.  PSYCHIATRIC: The patient is alert and oriented x 3.  SKIN: No obvious rash, lesion, or ulcer.  DATA REVIEW:   CBC Recent Labs  Lab 08/27/17 0434  WBC 5.8  HGB 11.2*  HCT 31.4*  PLT 192    Chemistries  Recent Labs  Lab 08/26/17 1639  08/28/17 0421  NA 134*   < > 138  K 4.0   < > 3.9  CL 96*   < > 101  CO2 27   < > 29  GLUCOSE 122*   < > 116*  BUN  21   < > 19  CREATININE 0.94   < > 0.82  CALCIUM 8.9   < > 9.0  AST 23  --   --   ALT 16  --   --   ALKPHOS 79  --   --   BILITOT 0.6  --   --    < > = values in this interval not displayed.     Microbiology Results  Results for orders placed or performed during the hospital encounter of 08/26/17  Urine Culture     Status: None   Collection Time: 08/26/17  5:26 PM  Result Value Ref Range Status   Specimen Description   Final    URINE, RANDOM Performed at [facility], [address] [place], [place] [address]    Special Requests   Final    NONE Performed at [facility], [place], [place]  [address]    Culture   Final    NO GROWTH Performed at [facility], [place] [address], [place], [place] [address]    Report Status 08/28/2017 FINAL  Final    RADIOLOGY:  No results found.   Management plans discussed with the patient, her daughter and son and they are in agreement.  CODE STATUS: DNR   TOTAL TIME TAKING CARE OF THIS PATIENT: 37 minutes.    Demetrios Loll M.D on 08/28/2017 at 1:53 PM  Between 7am to 6pm - Pager - [phone]  After 6pm go to [email or web address] - password [email or web address] [facility]  [facility]  Office  [phone]  CC: Primary care physician; Adin Hector, MD   Note: This dictation was prepared with Dragon dictation along with smaller phrase technology. Any transcriptional errors that result from this process are unintentional.

## 2017-08-28 NOTE — Evaluation (Signed)
Physical Therapy Evaluation Patient Details Name: Lynn Griffith MRN: [id number] DOB: 04-13-1924 Today's Date: 08/28/2017   History of Present Illness  pt admitted to 08/26/17 after with visual hallucinations and subsequently diagnosed with a UTI. Pt has past medical history that includes breast and uterine cancer as well as asthma.    Clinical Impression  Pt is a pleasant 84 year old female who was admitted for a UTI. Pt performs bed mobility, transfers, and ambulation modified independent to CGA. Pt demonstrates deficits with strength, balance, and endurance. Pt demonstrates grossly weak B UE. Pt demonstrates WFL however weak B LE. Pt amb 200' with CGA, RW, and chair follow however fatigues easily throughout. Pt did not demonstrate any gross LOB during gait however appears unsteady on her feet throughout. Pt instructed in there-ex which she tolerated well. Pt lives at an independent living facility and is independent at baseline using a RW. Pt A&O x4 at this encounter. Pt could benefit from continued skilled therapy at this time to improve deficits toward PLOF. PT will continue to work with pt at least 2x/week while admitted. D/c recommendations at this time are for home with home health PT to increase pts strength, balance and endurance thus preventing future admissions. Nursing and MD notified of d/c recommendations.      Follow Up Recommendations Home health PT    Equipment Recommendations  None recommended by PT    Recommendations for Other Services       Precautions / Restrictions Precautions Precautions: None Restrictions Weight Bearing Restrictions: No      Mobility  Bed Mobility Overal bed mobility: Independent             General bed mobility comments: supine to sit in appropriate amount of time with minimal UE support  Transfers Overall transfer level: Modified independent Equipment used: Rolling walker (2 wheeled)             General transfer comment: pt  transfers sit<>stand at RW independently without gross LOB safely with appropriate amount of UE use  Ambulation/Gait Ambulation/Gait assistance: Min guard Gait Distance (Feet): 200 Feet Assistive device: Rolling walker (2 wheeled) Gait Pattern/deviations: WFL(Within Functional Limits);Step-through pattern     General Gait Details: pt amb 200' with CGA, RW and chair follow. Pt did fatigue with gait however did not demonstrate alterations in gait pattern while fatigued. Pt and family report that pt amb similar to PLOF.  Stairs            Wheelchair Mobility    Modified Rankin (Stroke Patients Only)       Balance Overall balance assessment: Needs assistance   Sitting balance-Leahy Scale: Good Sitting balance - Comments: sits EOB unsupported without gross LOB for long period of time.      Standing balance-Leahy Scale: Fair Standing balance comment: pt requires B UE support on RW in standing however is able to remove hands for short periods to reach outside BOS to pull up pants.                             Pertinent Vitals/Pain Pain Assessment: No/denies pain    Home Living Family/patient expects to be discharged to:: Private residence(independent living facility) Living Arrangements: Alone Available Help at Discharge: Family Type of Home: Independent living facility Home Access: Level entry     Home Layout: One level Home Equipment: Walker - 4 wheels      Prior Function Level of Independence: Independent  with assistive device(s)         Comments: pt independent with RW at baseline. States that she amb throughout independent living facility without issues. One previous fall 6-8 months ago able to get self up, no injuries.     Hand Dominance        Extremity/Trunk Assessment   Upper Extremity Assessment Upper Extremity Assessment: Generalized weakness(grossly at least 3+/5 equal bilaterally)    Lower Extremity Assessment Lower Extremity  Assessment: Overall WFL for tasks assessed(Grossly at least 4/5 equal bilaterally)       Communication   Communication: No difficulties  Cognition Arousal/Alertness: Awake/alert Behavior During Therapy: WFL for tasks assessed/performed Overall Cognitive Status: Within Functional Limits for tasks assessed                                 General Comments: A&O x4      General Comments      Exercises Other Exercises Other Exercises: Pt instructed in B LE exercises x10 including SLR, LAQ, hip abd, seated marching and standing marching, pt tolerated ther-ex well   Assessment/Plan    PT Assessment Patient needs continued PT services  PT Problem List Decreased strength;Decreased range of motion;Decreased activity tolerance;Decreased balance;Decreased mobility;Decreased coordination;Decreased knowledge of use of DME;Decreased safety awareness;Decreased knowledge of precautions       PT Treatment Interventions Gait training;DME instruction;Stair training;Functional mobility training;Therapeutic activities;Therapeutic exercise;Balance training;Neuromuscular re-education;Patient/family education    PT Goals (Current goals can be found in the Care Plan section)  Acute Rehab PT Goals Patient Stated Goal: pt states she wants to be back to PLOF with mobility PT Goal Formulation: With patient Time For Goal Achievement: 09/11/17 Potential to Achieve Goals: Good    Frequency Min 2X/week   Barriers to discharge        Co-evaluation               AM-PAC PT "6 Clicks" Daily Activity  Outcome Measure Difficulty turning over in bed (including adjusting bedclothes, sheets and blankets)?: None Difficulty moving from lying on back to sitting on the side of the bed? : None Difficulty sitting down on and standing up from a chair with arms (e.g., wheelchair, bedside commode, etc,.)?: None Help needed moving to and from a bed to chair (including a wheelchair)?: None Help  needed walking in hospital room?: None Help needed climbing 3-5 steps with a railing? : A Little 6 Click Score: 23    End of Session Equipment Utilized During Treatment: Gait belt Activity Tolerance: Patient tolerated treatment well Patient left: in chair;with call bell/phone within reach;with chair alarm set;with family/visitor present Nurse Communication: Mobility status;Other (comment)(d/c recommendations) PT Visit Diagnosis: Unsteadiness on feet (R26.81);Other abnormalities of gait and mobility (R26.89);Muscle weakness (generalized) (M62.81);History of falling (Z91.81);Difficulty in walking, not elsewhere classified (R26.2)    Time: 9528-4132 PT Time Calculation (min) (ACUTE ONLY): 28 min   Charges:         PT G Codes:        Lynn Griffith, SPT   Lynn Griffith 08/28/2017, 2:11 PM

## 2017-08-28 NOTE — Progress Notes (Signed)
Patient discharged home with daughter, verbalized understanding of education. Patient with no complaints.  

## 2017-08-31 ENCOUNTER — Telehealth: Payer: Self-pay

## 2017-08-31 NOTE — Telephone Encounter (Signed)
EMMI Follow-up: Noted on the report that patient didn't know if she received discharge paperwork.  I talked with Lynn Griffith and she said her daughter had it and that a follow-up appointment has been made with Dr. Caryl Comes for Thursday, July 11th at 10 am. I let her know there would be one more automated call and to let us know if she had any other concerns.

## 2017-11-08 ENCOUNTER — Other Ambulatory Visit: Payer: Self-pay | Admitting: Neurology

## 2017-11-08 DIAGNOSIS — R413 Other amnesia: Secondary | ICD-10-CM

## 2017-11-20 ENCOUNTER — Ambulatory Visit: Payer: Medicare Other

## 2017-11-23 ENCOUNTER — Ambulatory Visit
Admission: RE | Admit: 2017-11-23 | Discharge: 2017-11-23 | Disposition: A | Discharge status: Home / Self Care | Payer: Medicare Other | Source: Ambulatory Visit | Attending: Neurology | Admitting: Neurology

## 2017-11-23 DIAGNOSIS — R413 Other amnesia: Secondary | ICD-10-CM | POA: Diagnosis not present

## 2018-08-01 ENCOUNTER — Other Ambulatory Visit: Payer: Self-pay | Admitting: Internal Medicine

## 2018-08-01 DIAGNOSIS — R7989 Other specified abnormal findings of blood chemistry: Secondary | ICD-10-CM

## 2018-08-02 ENCOUNTER — Other Ambulatory Visit: Payer: Self-pay

## 2018-08-02 ENCOUNTER — Ambulatory Visit
Admission: RE | Admit: 2018-08-02 | Discharge: 2018-08-02 | Disposition: A | Discharge status: Home / Self Care | Payer: Medicare Other | Source: Ambulatory Visit | Attending: Internal Medicine | Admitting: Internal Medicine

## 2018-08-02 DIAGNOSIS — R945 Abnormal results of liver function studies: Secondary | ICD-10-CM | POA: Diagnosis not present

## 2018-08-02 DIAGNOSIS — R7989 Other specified abnormal findings of blood chemistry: Secondary | ICD-10-CM

## 2018-08-04 ENCOUNTER — Other Ambulatory Visit: Payer: Self-pay | Admitting: Internal Medicine

## 2018-08-04 DIAGNOSIS — K7689 Other specified diseases of liver: Secondary | ICD-10-CM

## 2018-08-08 ENCOUNTER — Other Ambulatory Visit: Payer: Self-pay

## 2018-08-08 ENCOUNTER — Ambulatory Visit
Admission: RE | Admit: 2018-08-08 | Discharge: 2018-08-08 | Disposition: A | Discharge status: Home / Self Care | Payer: Medicare Other | Source: Ambulatory Visit | Attending: Internal Medicine | Admitting: Internal Medicine

## 2018-08-08 DIAGNOSIS — K7689 Other specified diseases of liver: Secondary | ICD-10-CM | POA: Diagnosis not present

## 2018-08-08 MED ORDER — GADOBUTROL 1 MMOL/ML IV SOLN
6.0000 mL | Freq: Once | INTRAVENOUS | Status: AC | PRN
  Administered 2018-08-08: 6 mL via INTRAVENOUS

## 2018-08-22 ENCOUNTER — Ambulatory Visit: Payer: Medicare Other | Admitting: Gastroenterology

## 2018-10-24 DEATH — deceased

## 2020-06-05 IMAGING — MR MR HEAD W/O CM
11 series · 43 of 48 positions shown · non-contrast
Comparison: CT head 08/27/2017

CLINICAL DATA: Memory loss

EXAM:
MRI HEAD WITHOUT CONTRAST
TECHNIQUE: Multiplanar, multiecho pulse sequences of the brain and surrounding
structures were obtained without intravenous contrast.

[Series 5: ax dwi_tracew · axial · 3.0mm · 0.73mm/px · z∈[-45,+114]mm · 5 of 55 slices shown]
[im 1/55]
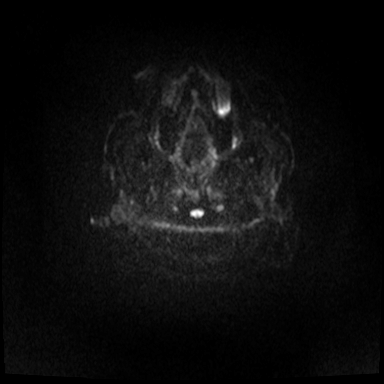
[im 14/55]
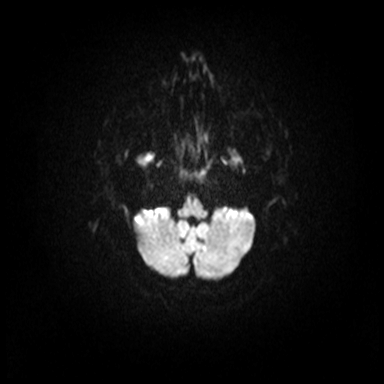
[im 28/55]
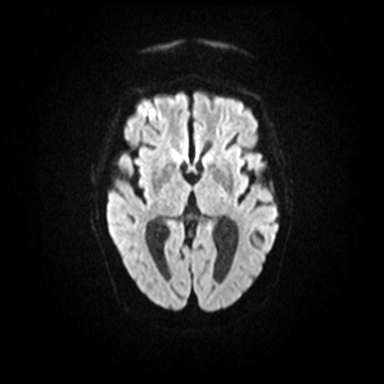
[im 41/55]
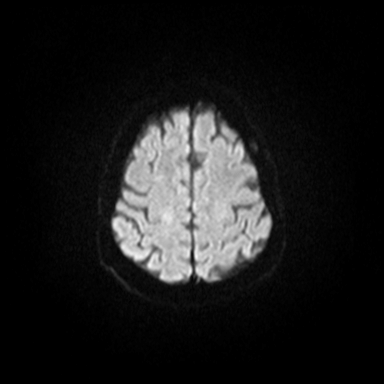
[im 55/55]
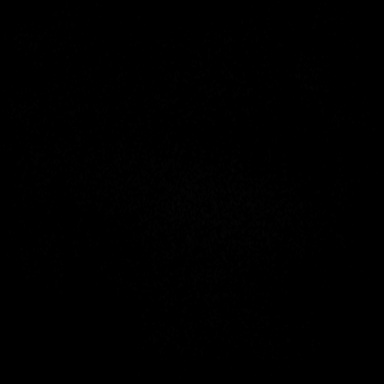

[Series 6: ax dwi_adc · axial · 3.0mm · 0.73mm/px · z∈[-45,+112]mm · 4 of 54 slices shown]
[im 1/54]
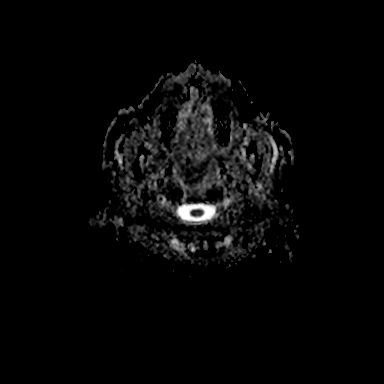
[im 18/54]
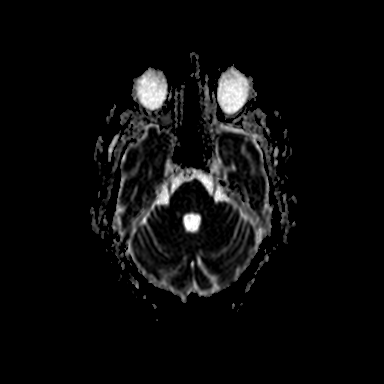
[im 36/54]
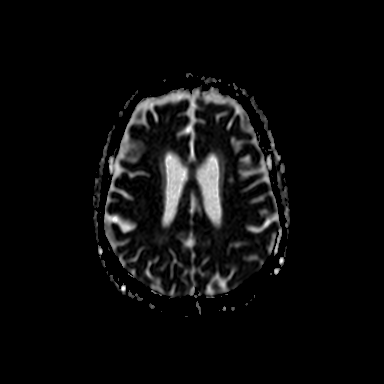
[im 54/54]
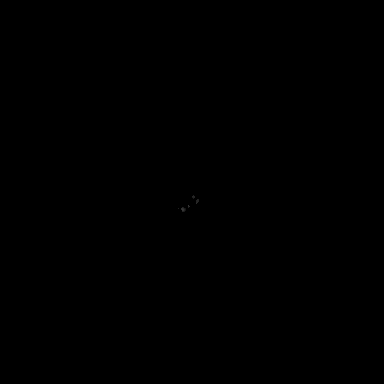

[Series 7: cor dwi_tracew · coronal · 5.0mm · 0.60mm/px · 3 of 37 slices shown]
[im 1/37]
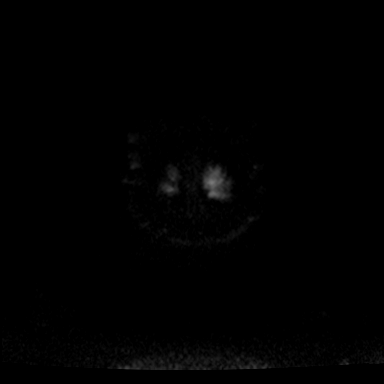
[im 19/37]
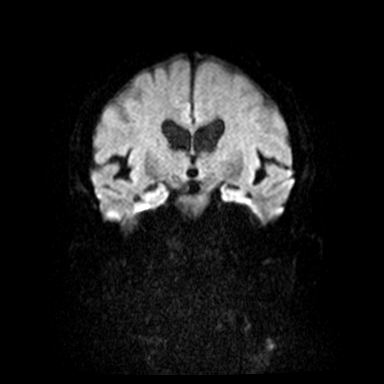
[im 37/37]
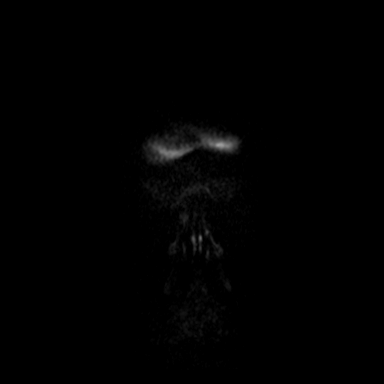

[Series 8: cor dwi_adc · coronal · 5.0mm · 0.60mm/px · 3 of 37 slices shown]
[im 1/37]
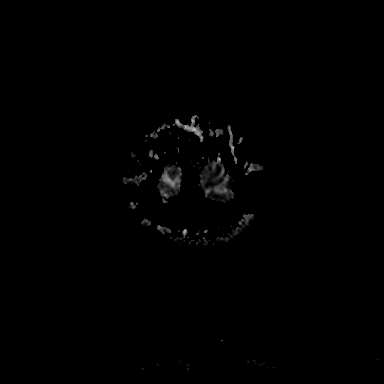
[im 19/37]
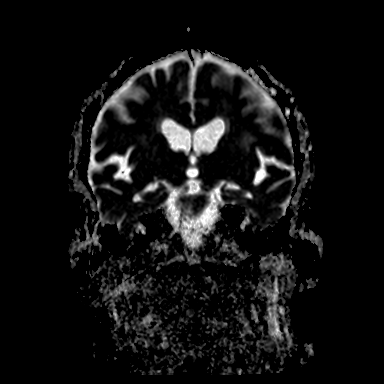
[im 37/37]
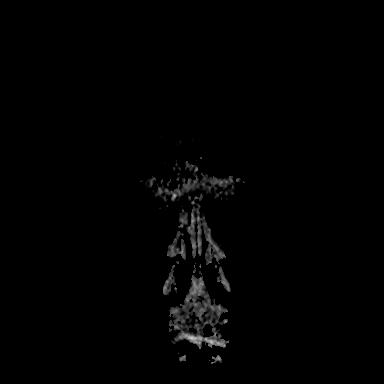

[Series 9: T1 · sagittal · 5.0mm · 0.62mm/px · 2 of 23 slices shown (1 of 2)]
[im 1/23]
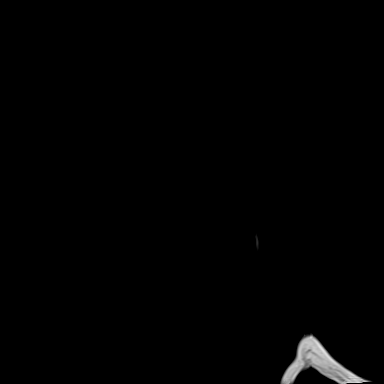
[im 23/23]
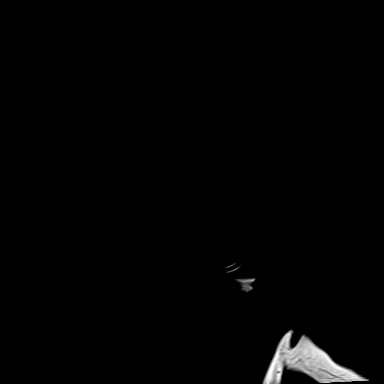

[Series 10: T2 · axial · 5.0mm · 0.53mm/px · z∈[-39,+103]mm · 2 of 25 slices shown (1 of 2)]
[im 1/25]
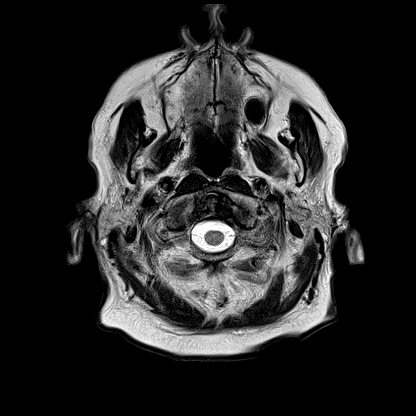
[im 25/25]
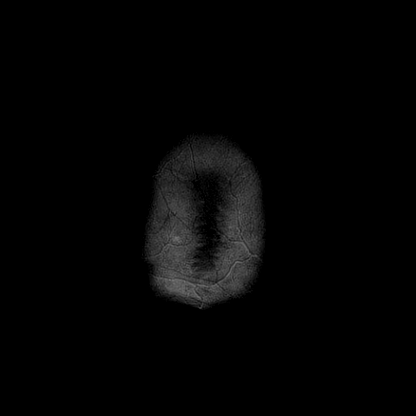

[Series 11: swi_images · axial · 3.0mm · 0.90mm/px · z∈[-54,+120]mm · 5 of 60 slices shown]
[im 1/60]
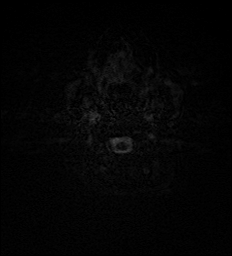
[im 15/60]
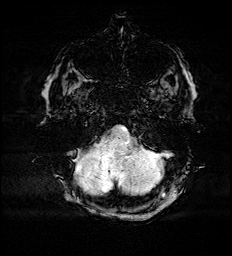
[im 30/60]
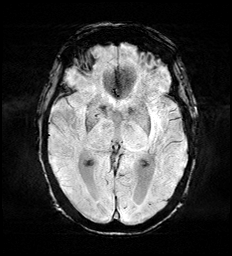
[im 45/60]
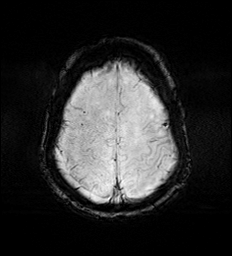
[im 60/60]
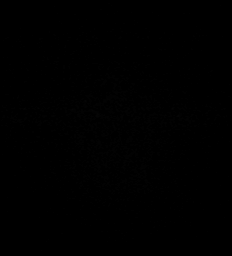

[Series 12: mip_images(sw) · axial · 24.0mm · 0.90mm/px · z∈[-44,+110]mm · 4 of 53 slices shown]
[im 1/53]
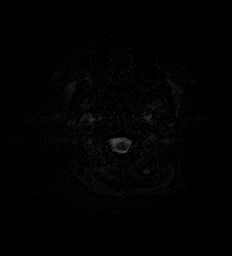
[im 18/53]
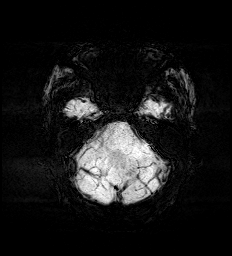
[im 35/53]
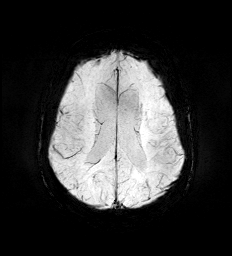
[im 53/53]
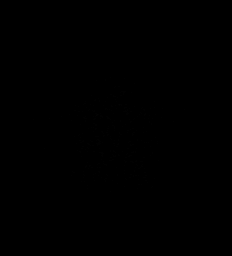

[Series 13: FLAIR · axial · 3.0mm · 0.53mm/px · z∈[-48,+112]mm · 4 of 55 slices shown]
[im 1/55]
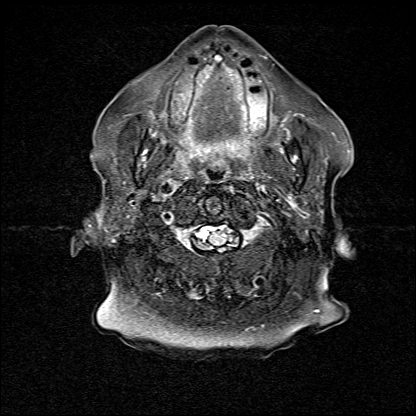
[im 19/55]
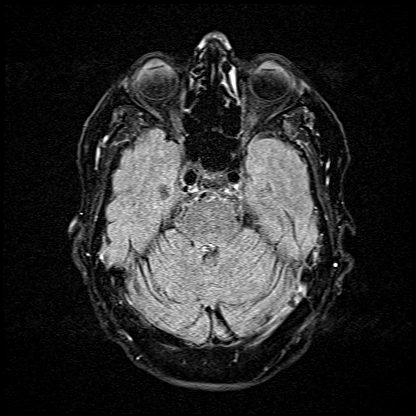
[im 37/55]
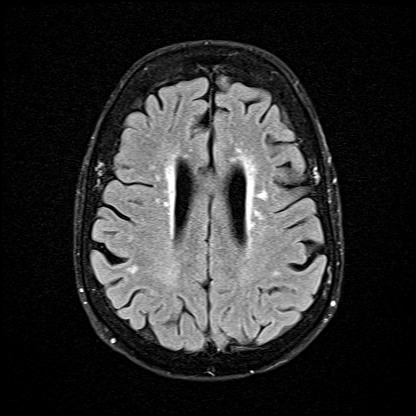
[im 55/55]
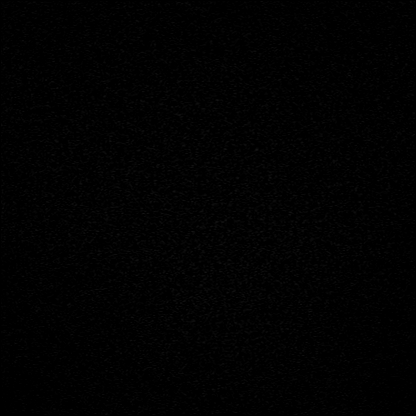

[Series 14: T1 · axial · 1.0mm · 0.98mm/px · z∈[-52,+121]mm · 9 of 176 slices shown (2 of 2)]
[im 1/176]
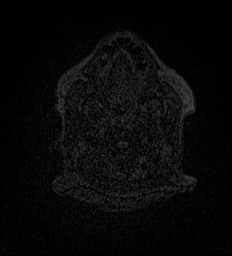
[im 14/176]
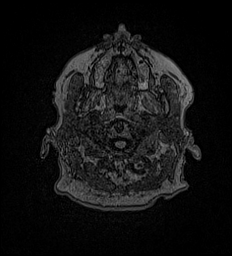
[im 27/176]
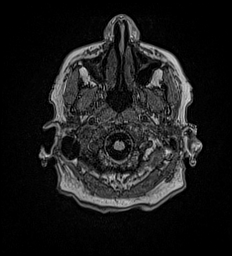
[im 54/176]
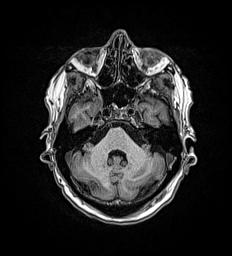
[im 81/176]
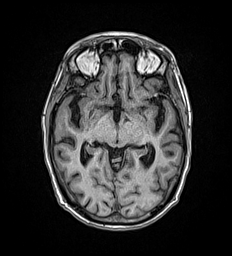
[im 95/176]
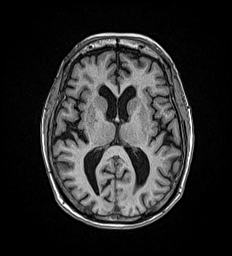
[im 122/176]
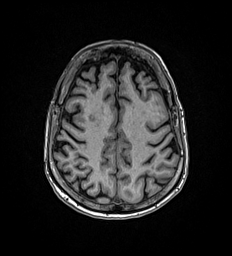
[im 149/176]
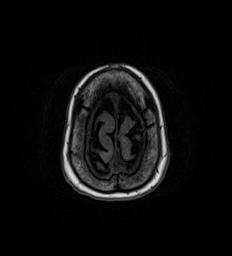
[im 176/176]
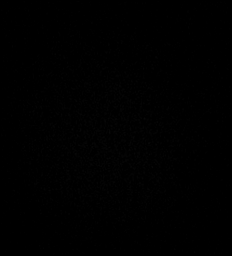

[Series 15: T2 · coronal · 5.0mm · 0.57mm/px · 2 of 29 slices shown (2 of 2)]
[im 1/29]
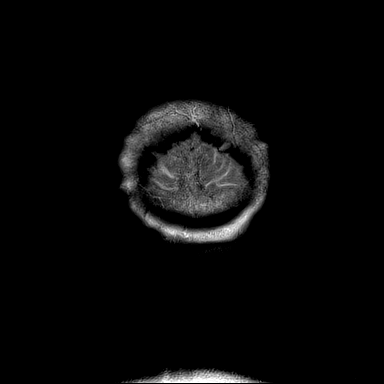
[im 29/29]
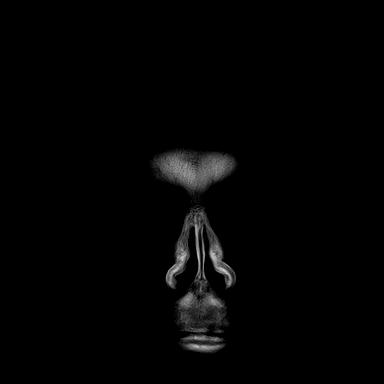

[43 of 48 positions shown; findings below may reference images not displayed]

FINDINGS: Brain: Mild ventricular enlargement consistent with atrophy
unchanged from the prior CT. Scattered small deep white matter
hyperintensities bilaterally. Brainstem and cerebellum normal.

Negative for acute infarct. Negative for hemorrhage, mass, or fluid
collection. No midline shift.

Vascular: Normal arterial flow voids.

Skull and upper cervical spine: No acute bony abnormality. Cervical
spondylosis.

Sinuses/Orbits: Mild mucosal edema paranasal sinuses. Bilateral
cataract surgery.

Other: None
IMPRESSION: Mild atrophy and mild chronic microvascular ischemic change in the
white matter. No acute abnormality.
